# Patient Record
Sex: Male | Born: 1990 | Race: White | Hispanic: No | Marital: Single | Smoking: Former smoker
Health system: Southern US, Community
[De-identification: ages and names within clinical notes are randomized; demographics above are authoritative.]

## PROBLEM LIST (undated history)

## (undated) HISTORY — PX: HIP FRACTURE SURGERY: SHX118

---
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
## 2013-03-16 ENCOUNTER — Emergency Department (HOSPITAL_COMMUNITY): Payer: Managed Care, Other (non HMO)

## 2013-03-16 ENCOUNTER — Emergency Department (HOSPITAL_COMMUNITY)
Admission: EM | Admit: 2013-03-16 | Discharge: 2013-03-16 | Disposition: A | Discharge status: Home / Self Care | Payer: Managed Care, Other (non HMO) | Attending: Emergency Medicine | Admitting: Emergency Medicine

## 2013-03-16 ENCOUNTER — Encounter (HOSPITAL_COMMUNITY): Payer: Self-pay | Admitting: Emergency Medicine

## 2013-03-16 DIAGNOSIS — N201 Calculus of ureter: Secondary | ICD-10-CM | POA: Insufficient documentation

## 2013-03-16 DIAGNOSIS — R1110 Vomiting, unspecified: Secondary | ICD-10-CM | POA: Insufficient documentation

## 2013-03-16 DIAGNOSIS — Z87891 Personal history of nicotine dependence: Secondary | ICD-10-CM | POA: Insufficient documentation

## 2013-03-16 LAB — CBC WITH DIFFERENTIAL/PLATELET
Basophils Absolute: 0 10*3/uL (ref 0.0–0.1)
Eosinophils Absolute: 0 10*3/uL (ref 0.0–0.7)
Eosinophils Relative: 1 % (ref 0–5)
Hemoglobin: 15.2 g/dL (ref 13.0–17.0)
Monocytes Relative: 7 % (ref 3–12)
Neutro Abs: 4.7 10*3/uL (ref 1.7–7.7)
Neutrophils Relative %: 68 % (ref 43–77)
Platelets: 191 10*3/uL (ref 150–400)
WBC: 7 10*3/uL (ref 4.0–10.5)

## 2013-03-16 LAB — URINALYSIS, ROUTINE W REFLEX MICROSCOPIC
Leukocytes, UA: NEGATIVE
Nitrite: NEGATIVE
pH: 8.5 — ABNORMAL HIGH (ref 5.0–8.0)

## 2013-03-16 LAB — BASIC METABOLIC PANEL
BUN: 18 mg/dL (ref 6–23)
CO2: 22 mEq/L (ref 19–32)
GFR calc non Af Amer: >90 mL/min (ref >90)
Potassium: 3.3 mEq/L — ABNORMAL LOW (ref 3.5–5.1)
Sodium: 140 mEq/L (ref 135–145)

## 2013-03-16 LAB — URINE MICROSCOPIC-ADD ON

## 2013-03-16 MED ORDER — HYDROMORPHONE HCL PF 1 MG/ML IJ SOLN
1.0000 mg | Freq: Once | INTRAMUSCULAR | Status: AC
  Administered 2013-03-16: 1 mg via INTRAVENOUS
  Filled 2013-03-16: qty 1

## 2013-03-16 MED ORDER — OXYCODONE-ACETAMINOPHEN 5-325 MG PO TABS: 1.0000 | ORAL_TABLET | Freq: Four times a day (QID) | ORAL | Status: DC | PRN

## 2013-03-16 MED ORDER — ONDANSETRON HCL 4 MG/2ML IJ SOLN
4.0000 mg | Freq: Once | INTRAMUSCULAR | Status: AC
  Administered 2013-03-16: 4 mg via INTRAVENOUS
  Filled 2013-03-16: qty 2

## 2013-03-16 MED ORDER — SODIUM CHLORIDE 0.9 % IV BOLUS (SEPSIS)
1000.0000 mL | Freq: Once | INTRAVENOUS | Status: AC
  Administered 2013-03-16: 1000 mL via INTRAVENOUS

## 2013-03-16 NOTE — ED Notes (Signed)
MD at bedside. 

## 2013-03-16 NOTE — ED Provider Notes (Signed)
History    CSN: [id number] Arrival date & time 03/16/13  1949  First MD Initiated Contact with Patient 03/16/13 1957     Chief Complaint  Patient presents with  . Flank Pain   (Consider location/radiation/quality/duration/timing/severity/associated sxs/prior Treatment) Patient is a 22 y.o. male presenting with flank pain.  Flank Pain   Pt reports sudden onset of waxing and waning severe aching L flank pain about 3 hrs ago, occasionally radiating to L groin, no dysuria or hematuria. Some vomiting. No fever. No particular provoking or relieving factors.  History reviewed. No pertinent past medical history. Past Surgical History  Procedure Laterality Date  . Hip fracture surgery     No family history on file. History  Substance Use Topics  . Smoking status: Former Games developer  . Smokeless tobacco: Not on file  . Alcohol Use: Yes     Comment: occ    Review of Systems  Genitourinary: Positive for flank pain.   All other systems reviewed and are negative except as noted in HPI.   Allergies  Review of patient's allergies indicates no known allergies.  Home Medications  No current outpatient prescriptions on file. BP 127/54  Pulse 86  Temp(Src) 98.2 F (36.8 C) (Oral)  Resp 18  SpO2 100% Physical Exam  Nursing note and vitals reviewed. Constitutional: He is oriented to person, place, and time. He appears well-developed and well-nourished.  HENT:  Head: Normocephalic and atraumatic.  Eyes: EOM are normal. Pupils are equal, round, and reactive to light.  Neck: Normal range of motion. Neck supple.  Cardiovascular: Normal rate, normal heart sounds and intact distal pulses.   Pulmonary/Chest: Effort normal and breath sounds normal.  Abdominal: Bowel sounds are normal. He exhibits no distension. There is no tenderness.  Musculoskeletal: Normal range of motion. He exhibits no edema and no tenderness.  Neurological: He is alert and oriented to person, place, and time. He has  normal strength. No cranial nerve deficit or sensory deficit.  Skin: Skin is warm and dry. No rash noted.  Psychiatric: He has a normal mood and affect.    ED Course  Procedures (including critical care time) Labs Reviewed  CBC WITH DIFFERENTIAL - Abnormal; Notable for the following:    MCHC 36.7 (*)    All other components within normal limits  BASIC METABOLIC PANEL - Abnormal; Notable for the following:    Potassium 3.3 (*)    Glucose, Bld 118 (*)    All other components within normal limits  URINALYSIS, ROUTINE W REFLEX MICROSCOPIC - Abnormal; Notable for the following:    pH 8.5 (*)    Hgb urine dipstick LARGE (*)    Ketones, ur 15 (*)    All other components within normal limits  URINE MICROSCOPIC-ADD ON   Ct Abdomen Pelvis Wo Contrast  03/16/2013   **ADDENDUM** CREATED: 03/16/2013 20:51:29  A prior study from 2007 is available for comparison.  That study is associated with a different medical record number, MRN: [id number].  Since the prior study, the large cystic lesion in the spleen is not significantly changed.  It is approximately the same size however septations have become somewhat calcified and this is compatible with a chronic finding.  Pelvic fractures have now healed with deformity.  **END ADDENDUM** SIGNED BY: Marlowe Aschoff. Hoss, M.D.  03/16/2013   *RADIOLOGY REPORT*  Clinical Data: Left flank pain  CT ABDOMEN AND PELVIS WITHOUT CONTRAST  Technique:  Multidetector CT imaging of the abdomen and pelvis was performed following the  standard protocol without intravenous contrast.  Comparison: None.  Findings: No hydronephrosis. No renal calculus.  There is a 5 mm calculus at the left ureteral vesicle junction associated with mild dilatation of the left ureter.  Left SI joint screw is in place.  There is some bony bridging across the superior aspect of the SI joint.  No breakage or loosening of the hardware.  Healed right superior pubic ramus fracture and inferior pubic ramus fracture  with deformity is noted. There is deformity of the left inferior and superior pubic rami compatible with healed deformity.  Sclerotic change in the right iliac wing has a nonaggressive appearance.  There is a 6.6 x 5.2 x 5.8 cm cystic lesion in the anterior upper spleen.  There are thick septations which are partially calcified. Splenic tissue is seen surrounding the lesion therefore this is felt to represent an intrasplenic lesion rather than a subcapsular lesion.  It is separate from the pancreas.  Tiny gallstones are present.  The liver, pancreas, adrenal glands are within normal limits  Bladder and prostate are unremarkable.  IMPRESSION: 5 mm left ureteral vesicle junction calculus is associated with mild dilatation of the left ureter.  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  There are thickened partially calcified septations.  This may represent sequela of prior trauma such as a resolving injury or hematoma. Infectious and neoplastic etiology are not entirely excluded.  Postoperative and post-traumatic changes of the pelvis with healed deformity.  Cholelithiasis.   Original Report Authenticated By: Jolaine Click, M.D.   1. Ureteral stone     MDM  Reviewed CT images. Discussed with Dr. Bonnielee Haff who has added addendum as above compared to previous. Pain improved. Pt has distal ureteral stone. Plan discharge with Urology followup.   Charles B. Bernette Mayers, MD 03/16/13 2237

## 2013-03-16 NOTE — ED Notes (Signed)
Discharge instructions and information explained to patient. Patient provided urine strainer and specimen cup. Given directions on its use as well as follow up. Patient verbalized understanding.

## 2013-03-16 NOTE — ED Notes (Signed)
Patient transported to CT 

## 2013-03-16 NOTE — ED Notes (Addendum)
Patient with left flank pain, with nausea and vomiting.  Patient states that the nausea and vomiting is worse when the pain is increased.  Patient is pale at this time, with diaphoresis.  Patient states that at its worst it is 8/10, sharp in nature and moved down to groin.

## 2021-09-05 ENCOUNTER — Other Ambulatory Visit: Payer: Self-pay

## 2021-09-05 DIAGNOSIS — Z87891 Personal history of nicotine dependence: Secondary | ICD-10-CM | POA: Diagnosis not present

## 2021-09-05 DIAGNOSIS — K8012 Calculus of gallbladder with acute and chronic cholecystitis without obstruction: Principal | ICD-10-CM | POA: Insufficient documentation

## 2021-09-05 DIAGNOSIS — R1011 Right upper quadrant pain: Secondary | ICD-10-CM | POA: Diagnosis present

## 2021-09-05 DIAGNOSIS — U071 COVID-19: Secondary | ICD-10-CM | POA: Insufficient documentation

## 2021-09-06 ENCOUNTER — Other Ambulatory Visit: Payer: Self-pay

## 2021-09-06 ENCOUNTER — Emergency Department (HOSPITAL_COMMUNITY): Payer: BC Managed Care – PPO

## 2021-09-06 ENCOUNTER — Encounter (HOSPITAL_COMMUNITY)
Admission: EM | Disposition: A | Discharge status: Home / Self Care | Payer: Self-pay | Source: Home / Self Care | Attending: Emergency Medicine

## 2021-09-06 ENCOUNTER — Encounter (HOSPITAL_COMMUNITY): Payer: Self-pay | Admitting: Emergency Medicine

## 2021-09-06 ENCOUNTER — Observation Stay (HOSPITAL_COMMUNITY)
Admission: EM | Admit: 2021-09-06 | Discharge: 2021-09-06 | Disposition: A | Discharge status: Home / Self Care | Payer: BC Managed Care – PPO | Attending: Surgery | Admitting: Surgery

## 2021-09-06 ENCOUNTER — Observation Stay (HOSPITAL_COMMUNITY): Payer: BC Managed Care – PPO | Admitting: Anesthesiology

## 2021-09-06 DIAGNOSIS — K819 Cholecystitis, unspecified: Secondary | ICD-10-CM

## 2021-09-06 DIAGNOSIS — K810 Acute cholecystitis: Secondary | ICD-10-CM | POA: Diagnosis present

## 2021-09-06 DIAGNOSIS — R52 Pain, unspecified: Secondary | ICD-10-CM

## 2021-09-06 HISTORY — PX: CHOLECYSTECTOMY: SHX55

## 2021-09-06 LAB — CBC WITH DIFFERENTIAL/PLATELET
Abs Immature Granulocytes: 0.02 10*3/uL (ref 0.00–0.07)
Basophils Absolute: 0 10*3/uL (ref 0.0–0.1)
Basophils Relative: 0 %
Eosinophils Absolute: 0 10*3/uL (ref 0.0–0.5)
Eosinophils Relative: 0 %
HCT: 41.7 % (ref 39.0–52.0)
Hemoglobin: 14.8 g/dL (ref 13.0–17.0)
Immature Granulocytes: 0 %
Lymphocytes Relative: 12 %
Lymphs Abs: 0.9 10*3/uL (ref 0.7–4.0)
MCH: 30.3 pg (ref 26.0–34.0)
MCHC: 35.5 g/dL (ref 30.0–36.0)
MCV: 85.5 fL (ref 80.0–100.0)
Monocytes Absolute: 0.5 10*3/uL (ref 0.1–1.0)
Monocytes Relative: 6 %
Neutro Abs: 6.3 10*3/uL (ref 1.7–7.7)
Neutrophils Relative %: 82 %
Platelets: 260 10*3/uL (ref 150–400)
RBC: 4.88 MIL/uL (ref 4.22–5.81)
RDW: 11.6 % (ref 11.5–15.5)
WBC: 7.8 10*3/uL (ref 4.0–10.5)
nRBC: 0 % (ref 0.0–0.2)

## 2021-09-06 LAB — COMPREHENSIVE METABOLIC PANEL
ALT: 18 U/L (ref 0–44)
AST: 18 U/L (ref 15–41)
Albumin: 4.2 g/dL (ref 3.5–5.0)
Alkaline Phosphatase: 67 U/L (ref 38–126)
Anion gap: 15 (ref 5–15)
BUN: 13 mg/dL (ref 6–20)
CO2: 19 mmol/L — ABNORMAL LOW (ref 22–32)
Calcium: 9.3 mg/dL (ref 8.9–10.3)
Chloride: 103 mmol/L (ref 98–111)
Creatinine, Ser: 1.01 mg/dL (ref 0.61–1.24)
GFR, Estimated: >60 mL/min (ref >60)
Glucose, Bld: 133 mg/dL — ABNORMAL HIGH (ref 70–99)
Potassium: 3.3 mmol/L — ABNORMAL LOW (ref 3.5–5.1)
Sodium: 137 mmol/L (ref 135–145)
Total Bilirubin: 1.2 mg/dL (ref 0.3–1.2)
Total Protein: 6.9 g/dL (ref 6.5–8.1)

## 2021-09-06 LAB — URINALYSIS, ROUTINE W REFLEX MICROSCOPIC
Bilirubin Urine: NEGATIVE
Glucose, UA: NEGATIVE mg/dL
Hgb urine dipstick: NEGATIVE
Ketones, ur: >80 mg/dL — AB
Leukocytes,Ua: NEGATIVE
Nitrite: NEGATIVE
Protein, ur: NEGATIVE mg/dL
Specific Gravity, Urine: 1.025 (ref 1.005–1.030)
pH: 6 (ref 5.0–8.0)

## 2021-09-06 LAB — RESP PANEL BY RT-PCR (FLU A&B, COVID) ARPGX2
Influenza A by PCR: NEGATIVE
Influenza B by PCR: NEGATIVE
SARS Coronavirus 2 by RT PCR: POSITIVE — AB

## 2021-09-06 LAB — LIPASE, BLOOD: Lipase: 33 U/L (ref 11–51)

## 2021-09-06 SURGERY — LAPAROSCOPIC CHOLECYSTECTOMY
Anesthesia: General | Site: Abdomen

## 2021-09-06 MED ORDER — ENOXAPARIN SODIUM 40 MG/0.4ML IJ SOSY: 40.0000 mg | PREFILLED_SYRINGE | INTRAMUSCULAR | Status: DC

## 2021-09-06 MED ORDER — SODIUM CHLORIDE 0.9 % IR SOLN
Status: DC | PRN
  Administered 2021-09-06: 1000 mL

## 2021-09-06 MED ORDER — ONDANSETRON 4 MG PO TBDP: 4.0000 mg | ORAL_TABLET | Freq: Four times a day (QID) | ORAL | Status: DC | PRN

## 2021-09-06 MED ORDER — LIDOCAINE 2% (20 MG/ML) 5 ML SYRINGE
INTRAMUSCULAR | Status: DC | PRN
  Administered 2021-09-06: 100 mg via INTRAVENOUS

## 2021-09-06 MED ORDER — DOCUSATE SODIUM 100 MG PO CAPS: 100.0000 mg | ORAL_CAPSULE | Freq: Two times a day (BID) | ORAL | Status: DC

## 2021-09-06 MED ORDER — IBUPROFEN 200 MG PO TABS: 600.0000 mg | ORAL_TABLET | Freq: Three times a day (TID) | ORAL | 0 refills | Status: AC | PRN

## 2021-09-06 MED ORDER — SODIUM CHLORIDE 0.9 % IV SOLN
2.0000 g | INTRAVENOUS | Status: DC
  Administered 2021-09-06: 2 g via INTRAVENOUS
  Filled 2021-09-06: qty 20

## 2021-09-06 MED ORDER — SUGAMMADEX SODIUM 200 MG/2ML IV SOLN
INTRAVENOUS | Status: DC | PRN
  Administered 2021-09-06: 100 mg via INTRAVENOUS
  Administered 2021-09-06: 200 mg via INTRAVENOUS

## 2021-09-06 MED ORDER — ONDANSETRON HCL 4 MG/2ML IJ SOLN
INTRAMUSCULAR | Status: DC | PRN
Start: 2021-09-06 — End: 2021-09-06
  Administered 2021-09-06: 4 mg via INTRAVENOUS

## 2021-09-06 MED ORDER — DIPHENHYDRAMINE HCL 50 MG/ML IJ SOLN: 25.0000 mg | Freq: Four times a day (QID) | INTRAMUSCULAR | Status: DC | PRN

## 2021-09-06 MED ORDER — CHLORHEXIDINE GLUCONATE 0.12 % MT SOLN
OROMUCOSAL | Status: AC
  Administered 2021-09-06: 15 mL
  Filled 2021-09-06: qty 15

## 2021-09-06 MED ORDER — HYDRALAZINE HCL 20 MG/ML IJ SOLN: 10.0000 mg | Freq: Three times a day (TID) | INTRAMUSCULAR | Status: DC | PRN

## 2021-09-06 MED ORDER — BUPIVACAINE-EPINEPHRINE (PF) 0.25% -1:200000 IJ SOLN
INTRAMUSCULAR | Status: AC
  Filled 2021-09-06: qty 30

## 2021-09-06 MED ORDER — OXYCODONE HCL 5 MG PO TABS: 5.0000 mg | ORAL_TABLET | ORAL | Status: DC | PRN

## 2021-09-06 MED ORDER — DEXMEDETOMIDINE (PRECEDEX) IN NS 20 MCG/5ML (4 MCG/ML) IV SYRINGE
PREFILLED_SYRINGE | INTRAVENOUS | Status: DC | PRN
  Administered 2021-09-06: 80 ug via INTRAVENOUS
  Administered 2021-09-06: 20 ug via INTRAVENOUS

## 2021-09-06 MED ORDER — PROPOFOL 10 MG/ML IV BOLUS
INTRAVENOUS | Status: AC
  Filled 2021-09-06: qty 20

## 2021-09-06 MED ORDER — DEXAMETHASONE SODIUM PHOSPHATE 10 MG/ML IJ SOLN
INTRAMUSCULAR | Status: DC | PRN
Start: 2021-09-06 — End: 2021-09-06
  Administered 2021-09-06: 5 mg via INTRAVENOUS

## 2021-09-06 MED ORDER — AMISULPRIDE (ANTIEMETIC) 5 MG/2ML IV SOLN: 10.0000 mg | Freq: Once | INTRAVENOUS | Status: DC | PRN

## 2021-09-06 MED ORDER — POLYETHYLENE GLYCOL 3350 17 G PO PACK: 17.0000 g | PACK | Freq: Every day | ORAL | Status: DC | PRN

## 2021-09-06 MED ORDER — OXYCODONE HCL 5 MG PO TABS
5.0000 mg | ORAL_TABLET | Freq: Four times a day (QID) | ORAL | 0 refills | Status: AC | PRN
Start: 2021-09-06 — End: ?

## 2021-09-06 MED ORDER — MORPHINE SULFATE (PF) 2 MG/ML IV SOLN: 2.0000 mg | INTRAVENOUS | Status: DC | PRN

## 2021-09-06 MED ORDER — ACETAMINOPHEN 500 MG PO TABS: 1000.0000 mg | ORAL_TABLET | Freq: Four times a day (QID) | ORAL | Status: DC | PRN

## 2021-09-06 MED ORDER — BUPIVACAINE-EPINEPHRINE 0.25% -1:200000 IJ SOLN
INTRAMUSCULAR | Status: DC | PRN
  Administered 2021-09-06: 20 mL

## 2021-09-06 MED ORDER — 0.9 % SODIUM CHLORIDE (POUR BTL) OPTIME
TOPICAL | Status: DC | PRN
  Administered 2021-09-06: 1000 mL

## 2021-09-06 MED ORDER — SODIUM CHLORIDE 0.9 % IV SOLN: INTRAVENOUS | Status: DC

## 2021-09-06 MED ORDER — CELECOXIB 200 MG PO CAPS
200.0000 mg | ORAL_CAPSULE | Freq: Once | ORAL | Status: AC
  Administered 2021-09-06: 200 mg via ORAL
  Filled 2021-09-06: qty 1

## 2021-09-06 MED ORDER — FENTANYL CITRATE (PF) 100 MCG/2ML IJ SOLN: 25.0000 ug | INTRAMUSCULAR | Status: DC | PRN

## 2021-09-06 MED ORDER — PROPOFOL 10 MG/ML IV BOLUS
INTRAVENOUS | Status: DC | PRN
Start: 2021-09-06 — End: 2021-09-06
  Administered 2021-09-06: 150 mg via INTRAVENOUS

## 2021-09-06 MED ORDER — ACETAMINOPHEN 500 MG PO TABS
1000.0000 mg | ORAL_TABLET | Freq: Once | ORAL | Status: AC
  Administered 2021-09-06: 1000 mg via ORAL
  Filled 2021-09-06: qty 2

## 2021-09-06 MED ORDER — FENTANYL CITRATE (PF) 250 MCG/5ML IJ SOLN
INTRAMUSCULAR | Status: AC
  Filled 2021-09-06: qty 5

## 2021-09-06 MED ORDER — ONDANSETRON HCL 4 MG/2ML IJ SOLN
INTRAMUSCULAR | Status: AC
  Filled 2021-09-06: qty 8

## 2021-09-06 MED ORDER — DEXAMETHASONE SODIUM PHOSPHATE 10 MG/ML IJ SOLN
INTRAMUSCULAR | Status: AC
  Filled 2021-09-06: qty 1

## 2021-09-06 MED ORDER — MIDAZOLAM HCL 2 MG/2ML IJ SOLN
INTRAMUSCULAR | Status: DC | PRN
  Administered 2021-09-06: 2 mg via INTRAVENOUS

## 2021-09-06 MED ORDER — CHLORHEXIDINE GLUCONATE 0.12 % MT SOLN: 15.0000 mL | Freq: Once | OROMUCOSAL | Status: AC

## 2021-09-06 MED ORDER — ACETAMINOPHEN 500 MG PO TABS: 1000.0000 mg | ORAL_TABLET | Freq: Four times a day (QID) | ORAL | 0 refills | Status: AC | PRN

## 2021-09-06 MED ORDER — OXYCODONE-ACETAMINOPHEN 5-325 MG PO TABS
1.0000 | ORAL_TABLET | Freq: Once | ORAL | Status: AC
  Administered 2021-09-06: 1 via ORAL
  Filled 2021-09-06: qty 1

## 2021-09-06 MED ORDER — ONDANSETRON HCL 4 MG/2ML IJ SOLN: 4.0000 mg | Freq: Four times a day (QID) | INTRAMUSCULAR | Status: DC | PRN

## 2021-09-06 MED ORDER — DIPHENHYDRAMINE HCL 25 MG PO CAPS: 25.0000 mg | ORAL_CAPSULE | Freq: Four times a day (QID) | ORAL | Status: DC | PRN

## 2021-09-06 MED ORDER — DEXMEDETOMIDINE (PRECEDEX) IN NS 20 MCG/5ML (4 MCG/ML) IV SYRINGE
PREFILLED_SYRINGE | INTRAVENOUS | Status: AC
  Filled 2021-09-06: qty 5

## 2021-09-06 MED ORDER — ONDANSETRON 4 MG PO TBDP
4.0000 mg | ORAL_TABLET | Freq: Once | ORAL | Status: AC
  Administered 2021-09-06: 4 mg via ORAL
  Filled 2021-09-06: qty 1

## 2021-09-06 MED ORDER — MIDAZOLAM HCL 2 MG/2ML IJ SOLN
INTRAMUSCULAR | Status: AC
  Filled 2021-09-06: qty 2

## 2021-09-06 MED ORDER — SCOPOLAMINE 1 MG/3DAYS TD PT72
1.0000 | MEDICATED_PATCH | TRANSDERMAL | Status: DC
  Administered 2021-09-06: 1.5 mg via TRANSDERMAL
  Filled 2021-09-06: qty 1

## 2021-09-06 MED ORDER — LACTATED RINGERS IV SOLN
INTRAVENOUS | Status: DC | PRN
Start: 2021-09-06 — End: 2021-09-06

## 2021-09-06 MED ORDER — METHOCARBAMOL 500 MG PO TABS: 500.0000 mg | ORAL_TABLET | Freq: Four times a day (QID) | ORAL | Status: DC | PRN

## 2021-09-06 MED ORDER — PROMETHAZINE HCL 25 MG/ML IJ SOLN: 6.2500 mg | INTRAMUSCULAR | Status: DC | PRN

## 2021-09-06 MED ORDER — ROCURONIUM BROMIDE 10 MG/ML (PF) SYRINGE
PREFILLED_SYRINGE | INTRAVENOUS | Status: DC | PRN
  Administered 2021-09-06 (×2): 10 mg via INTRAVENOUS
  Administered 2021-09-06: 80 mg via INTRAVENOUS

## 2021-09-06 MED ORDER — FENTANYL CITRATE (PF) 250 MCG/5ML IJ SOLN
INTRAMUSCULAR | Status: DC | PRN
  Administered 2021-09-06: 100 ug via INTRAVENOUS
  Administered 2021-09-06: 50 ug via INTRAVENOUS
  Administered 2021-09-06: 100 ug via INTRAVENOUS

## 2021-09-06 SURGICAL SUPPLY — 41 items
ADH SKN CLS APL DERMABOND .7 (GAUZE/BANDAGES/DRESSINGS) ×2
APL PRP STRL LF DISP 70% ISPRP (MISCELLANEOUS) ×2
APPLIER CLIP 5 13 M/L LIGAMAX5 (MISCELLANEOUS) ×3
APR CLP MED LRG 5 ANG JAW (MISCELLANEOUS) ×2
BAG COUNTER SPONGE SURGICOUNT (BAG) ×4 IMPLANT
BAG SPEC RTRVL LRG 6X4 10 (ENDOMECHANICALS) ×2
BAG SPNG CNTER NS LX DISP (BAG) ×2
BLADE CLIPPER SURG (BLADE) IMPLANT
CANISTER SUCT 3000ML PPV (MISCELLANEOUS) ×4 IMPLANT
CHLORAPREP W/TINT 26 (MISCELLANEOUS) ×4 IMPLANT
CLIP APPLIE 5 13 M/L LIGAMAX5 (MISCELLANEOUS) ×3 IMPLANT
COVER MAYO STAND STRL (DRAPES) IMPLANT
COVER SURGICAL LIGHT HANDLE (MISCELLANEOUS) ×4 IMPLANT
DERMABOND ADVANCED (GAUZE/BANDAGES/DRESSINGS) ×1
DERMABOND ADVANCED .7 DNX12 (GAUZE/BANDAGES/DRESSINGS) ×3 IMPLANT
DRAPE C-ARM 42X120 X-RAY (DRAPES) IMPLANT
ELECT REM PT RETURN 9FT ADLT (ELECTROSURGICAL) ×3
ELECTRODE REM PT RTRN 9FT ADLT (ELECTROSURGICAL) ×3 IMPLANT
GLOVE SURG SIGNA 7.5 PF LTX (GLOVE) ×4 IMPLANT
GOWN STRL REUS W/ TWL LRG LVL3 (GOWN DISPOSABLE) ×6 IMPLANT
GOWN STRL REUS W/ TWL XL LVL3 (GOWN DISPOSABLE) ×3 IMPLANT
GOWN STRL REUS W/TWL LRG LVL3 (GOWN DISPOSABLE) ×6
GOWN STRL REUS W/TWL XL LVL3 (GOWN DISPOSABLE) ×3
KIT BASIN OR (CUSTOM PROCEDURE TRAY) ×4 IMPLANT
KIT TURNOVER KIT B (KITS) ×4 IMPLANT
NS IRRIG 1000ML POUR BTL (IV SOLUTION) ×4 IMPLANT
PAD ARMBOARD 7.5X6 YLW CONV (MISCELLANEOUS) ×4 IMPLANT
POUCH SPECIMEN RETRIEVAL 10MM (ENDOMECHANICALS) ×4 IMPLANT
SCISSORS LAP 5X35 DISP (ENDOMECHANICALS) ×4 IMPLANT
SET CHOLANGIOGRAPH 5 50 .035 (SET/KITS/TRAYS/PACK) IMPLANT
SET IRRIG TUBING LAPAROSCOPIC (IRRIGATION / IRRIGATOR) ×4 IMPLANT
SET TUBE SMOKE EVAC HIGH FLOW (TUBING) ×4 IMPLANT
SLEEVE ENDOPATH XCEL 5M (ENDOMECHANICALS) ×8 IMPLANT
SPECIMEN JAR SMALL (MISCELLANEOUS) ×4 IMPLANT
SUT MNCRL AB 4-0 PS2 18 (SUTURE) ×6 IMPLANT
TOWEL GREEN STERILE (TOWEL DISPOSABLE) ×4 IMPLANT
TOWEL GREEN STERILE FF (TOWEL DISPOSABLE) ×4 IMPLANT
TRAY LAPAROSCOPIC MC (CUSTOM PROCEDURE TRAY) ×4 IMPLANT
TROCAR XCEL BLUNT TIP 100MML (ENDOMECHANICALS) ×4 IMPLANT
TROCAR XCEL NON-BLD 5MMX100MML (ENDOMECHANICALS) ×4 IMPLANT
WATER STERILE IRR 1000ML POUR (IV SOLUTION) ×4 IMPLANT

## 2021-09-06 NOTE — Transfer of Care (Signed)
Immediate Anesthesia Transfer of Care Note  Patient: Angel Huber  Procedure(s) Performed: LAPAROSCOPIC CHOLECYSTECTOMY WITH POSSIBLE INTRAOPERATIVE CHOLANGIOGRAM (Abdomen)  Patient Location: PACU  Anesthesia Type:General  Level of Consciousness: drowsy, patient cooperative and responds to stimulation  Airway & Oxygen Therapy: Patient Spontanous Breathing  Post-op Assessment: Report given to RN and Post -op Vital signs reviewed and stable  Post vital signs: Reviewed and stable  Last Vitals:  Vitals Value Taken Time  BP 139/75 09/06/21 1701  Temp 37.1 C 09/06/21 1700  Pulse 90 09/06/21 1704  Resp 22 09/06/21 1704  SpO2 98 % 09/06/21 1704  Vitals shown include unvalidated device data.  Last Pain:  Vitals:   09/06/21 1518  TempSrc: Oral  PainSc: 0-No pain         Complications: No notable events documented.

## 2021-09-06 NOTE — Anesthesia Postprocedure Evaluation (Signed)
Anesthesia Post Note  Patient: Angel Huber  Procedure(s) Performed: LAPAROSCOPIC CHOLECYSTECTOMY WITH POSSIBLE INTRAOPERATIVE CHOLANGIOGRAM (Abdomen)     Patient location during evaluation: PACU Anesthesia Type: General Level of consciousness: sedated Pain management: pain level controlled Vital Signs Assessment: post-procedure vital signs reviewed and stable Respiratory status: spontaneous breathing and respiratory function stable Cardiovascular status: stable Postop Assessment: no apparent nausea or vomiting Anesthetic complications: no   No notable events documented.  Last Vitals:  Vitals:   09/06/21 1730 09/06/21 1745  BP: 119/76 115/67  Pulse: 66 66  Resp: 17 19  Temp:  37.1 C  SpO2: 98% 96%    Last Pain:  Vitals:   09/06/21 1745  TempSrc:   PainSc: 0-No pain                 Kavon Valenza DANIEL

## 2021-09-06 NOTE — Anesthesia Preprocedure Evaluation (Addendum)
Anesthesia Evaluation  Patient identified by MRN, date of birth, ID band Patient awake    Reviewed: Allergy & Precautions, NPO status , Patient's Chart, lab work & pertinent test results  History of Anesthesia Complications Negative for: history of anesthetic complications  Airway Mallampati: II  TM Distance: >3 FB Neck ROM: Full    Dental no notable dental hx. (+) Dental Advisory Given   Pulmonary neg pulmonary ROS, former smoker,    Pulmonary exam normal        Cardiovascular negative cardio ROS Normal cardiovascular exam     Neuro/Psych negative neurological ROS     GI/Hepatic Neg liver ROS,   Endo/Other  negative endocrine ROS  Renal/GU negative Renal ROS     Musculoskeletal negative musculoskeletal ROS (+)   Abdominal   Peds  Hematology negative hematology ROS (+)   Anesthesia Other Findings   Reproductive/Obstetrics                           Anesthesia Physical Anesthesia Plan  ASA: 2  Anesthesia Plan: General   Post-op Pain Management: Celebrex PO (pre-op) and Tylenol PO (pre-op)   Induction:   PONV Risk Score and Plan: 4 or greater and Ondansetron, Dexamethasone, Midazolam and Scopolamine patch - Pre-op  Airway Management Planned: Oral ETT  Additional Equipment:   Intra-op Plan:   Post-operative Plan: Extubation in OR  Informed Consent: I have reviewed the patients History and Physical, chart, labs and discussed the procedure including the risks, benefits and alternatives for the proposed anesthesia with the patient or authorized representative who has indicated his/her understanding and acceptance.     Dental advisory given  Plan Discussed with: Anesthesiologist, CRNA and Surgeon  Anesthesia Plan Comments:        Anesthesia Quick Evaluation

## 2021-09-06 NOTE — ED Notes (Signed)
RN unsuccessful x2 with IV

## 2021-09-06 NOTE — H&P (Addendum)
SNYDER BOSSIER 05/07/1991  [id number].    Requesting MD: Dr. Milton Ferguson Chief Complaint/Reason for Consult: Cholecystitis   HPI: Angel Huber is a 31 y.o. male with no significant pmhx who presented to the ED for abdominal pain.  Patient reports 2 days ago he began having constant right upper abdominal pain with radiation to his back/R flank. Initial episode improved but since then his symptoms have waxed and waned although were less severe. Last night he developed severe pain again. Associated with nausea and vomiting. Pain is worse after PO intake. He also felt like he looked a little yellow/jaundice the other day. No hx of the same.  He received Percocet in the ED with mild relief.  No associated fever, chills, diarrhea or urinary symptoms.    In the ED he has been afebrile without tachycardia or hypotension. Workup significant with WBC 7.8, lipase wnl, lft's wnl; CT renal stone study with Cholelithiasis with GB distension and question of cystic duct stone. RUQ [place] with 1.1 cm stone in gallbladder neck, gallbladder wall thickness to 7.4 mm.  No mention of pericholecystic fluid.  CBD within normal limits at 4.3 mm. We were asked to see.   He has had nothing to eat/drink today  No PMHx No history of prior abdominal surgeries He is not on blood thinners. Tobacco use: Former  Alcohol use: Occasional  Employment: Clinical cytogeneticist for [employer]  ROS: Review of Systems  Constitutional:  Negative for chills and fever.  Gastrointestinal:  Positive for abdominal pain, nausea and vomiting.  Genitourinary:  Positive for flank pain.  All other systems reviewed and are negative.  No family history on file.  History reviewed. No pertinent past medical history.  Past Surgical History:  Procedure Laterality Date   HIP FRACTURE SURGERY      Social History:  reports that he has quit smoking. He does not have any smokeless tobacco history on file. He reports current alcohol use. He  reports current drug use. Drug: Marijuana.  Allergies: No Known Allergies  (Not in a hospital admission)    Physical Exam: Blood pressure 109/64, pulse 61, temperature 98.3 F (36.8 C), temperature source Oral, resp. rate 20, height 6' (1.829 m), weight 86.2 kg, SpO2 97 %. General: pleasant, WD/WN male who is laying in bed in NAD HEENT: head is normocephalic, atraumatic.  Sclera are noninjected.  Pupils equal and round.  Ears and nose without any masses or lesions.  Mouth is pink and moist. Dentition fair Heart: regular, rate, and rhythm.  Normal s1,s2. No obvious murmurs, gallops, or rubs noted.  Palpable pedal pulses bilaterally  Lungs: CTAB, no wheezes, rhonchi, or rales noted.  Respiratory effort nonlabored Abd: Soft, ND, +BS, no masses, hernias, or organomegaly. Mild epigastric and RUQ TTP without peritonitis MS: no BUE/BLE edema, calves soft and nontender Skin: warm and dry with no masses, lesions, or rashes Psych: A&Ox4 with an appropriate affect Neuro: cranial nerves grossly intact, equal strength in BUE/BLE bilaterally, normal speech, thought process intact, moves all extremities, gait not assessed   Results for orders placed or performed during the hospital encounter of 09/06/21 (from the past 48 hour(s))  Comprehensive metabolic panel     Status: Abnormal   Collection Time: 09/06/21 12:37 AM  Result Value Ref Range   Sodium 137 135 - 145 mmol/L   Potassium 3.3 (L) 3.5 - 5.1 mmol/L   Chloride 103 98 - 111 mmol/L   CO2 19 (L) 22 - 32 mmol/L  Glucose, Bld 133 (H) 70 - 99 mg/dL    Comment: Glucose reference range applies only to samples taken after fasting for at least 8 hours.   BUN 13 6 - 20 mg/dL   Creatinine, Ser 1.01 0.61 - 1.24 mg/dL   Calcium 9.3 8.9 - 10.3 mg/dL   Total Protein 6.9 6.5 - 8.1 g/dL   Albumin 4.2 3.5 - 5.0 g/dL   AST 18 15 - 41 U/L   ALT 18 0 - 44 U/L   Alkaline Phosphatase 67 38 - 126 U/L   Total Bilirubin 1.2 0.3 - 1.2 mg/dL   GFR, Estimated  >60 >60 mL/min    Comment: (NOTE) Calculated using the CKD-EPI Creatinine Equation (2021)    Anion gap 15 5 - 15    Comment: Performed at [facility] [address] [place], [place] [address]  Lipase, blood     Status: None   Collection Time: 09/06/21 12:37 AM  Result Value Ref Range   Lipase 33 11 - 51 U/L    Comment: Performed at [facility], [place] [address] [place], [place] [address]  CBC with Differential     Status: None   Collection Time: 09/06/21 12:37 AM  Result Value Ref Range   WBC 7.8 4.0 - 10.5 K/uL   RBC 4.88 4.22 - 5.81 MIL/uL   Hemoglobin 14.8 13.0 - 17.0 g/dL   HCT 41.7 39.0 - 52.0 %   MCV 85.5 80.0 - 100.0 fL   MCH 30.3 26.0 - 34.0 pg   MCHC 35.5 30.0 - 36.0 g/dL   RDW 11.6 11.5 - 15.5 %   Platelets 260 150 - 400 K/uL   nRBC 0.0 0.0 - 0.2 %   Neutrophils Relative % 82 %   Neutro Abs 6.3 1.7 - 7.7 K/uL   Lymphocytes Relative 12 %   Lymphs Abs 0.9 0.7 - 4.0 K/uL   Monocytes Relative 6 %   Monocytes Absolute 0.5 0.1 - 1.0 K/uL   Eosinophils Relative 0 %   Eosinophils Absolute 0.0 0.0 - 0.5 K/uL   Basophils Relative 0 %   Basophils Absolute 0.0 0.0 - 0.1 K/uL   Immature Granulocytes 0 %   Abs Immature Granulocytes 0.02 0.00 - 0.07 K/uL    Comment: Performed at [facility], [address] [place], [place] [address]  Urinalysis, Routine w reflex microscopic Urine, Clean Catch     Status: Abnormal   Collection Time: 09/06/21 11:29 AM  Result Value Ref Range   Color, Urine YELLOW YELLOW   APPearance CLEAR CLEAR   Specific Gravity, Urine 1.025 1.005 - 1.030   pH 6.0 5.0 - 8.0   Glucose, UA NEGATIVE NEGATIVE mg/dL   Hgb urine dipstick NEGATIVE NEGATIVE   Bilirubin Urine NEGATIVE NEGATIVE   Ketones, ur >80 (A) NEGATIVE mg/dL   Protein, ur NEGATIVE NEGATIVE mg/dL   Nitrite NEGATIVE NEGATIVE   Leukocytes,Ua NEGATIVE NEGATIVE    Comment: Microscopic not done on urines with negative protein, blood, leukocytes, nitrite, or glucose < 500  mg/dL. Performed at [facility], [place] [address] [place], [place] [address]    CT Renal Stone Study  Result Date: 09/06/2021 CLINICAL DATA:  Flank pain. EXAM: CT ABDOMEN AND PELVIS WITHOUT CONTRAST TECHNIQUE: Multidetector CT imaging of the abdomen and pelvis was performed following the standard protocol without IV contrast. RADIATION DOSE REDUCTION: This exam was performed according to the departmental dose-optimization program which includes automated exposure control, adjustment of  the mA and/or kV according to patient size and/or use of iterative reconstruction technique. COMPARISON:  CT abdomen and pelvis 03/16/2013. FINDINGS: Lower chest: No acute abnormality. Hepatobiliary: Small gallstones are present. There is a questionable punctate calculus within the cystic duct image 3/25. Gallbladder is distended. There is no biliary ductal dilatation. Bile ducts are within normal limits. Pancreas: Unremarkable. No pancreatic ductal dilatation or surrounding inflammatory changes. Spleen: Complex cystic area in the spleen with calcified septations measuring 5.6 x 9.0 cm has mildly increased in size compared to 2014. Spleen is otherwise within normal limits. Adrenals/Urinary Tract: Adrenal glands are unremarkable. Kidneys are normal, without renal calculi, focal lesion, or hydronephrosis. Bladder is unremarkable. Stomach/Bowel: Stomach is within normal limits. Appendix appears normal. No evidence of bowel wall thickening, distention, or inflammatory changes. There is a large amount of stool throughout the colon. Vascular/Lymphatic: No significant vascular findings are present. No enlarged abdominal or pelvic lymph nodes. Reproductive: Prostate is unremarkable. Other: There is a small fat containing umbilical hernia. There is no ascites or free. Musculoskeletal: Left sacroiliac joint screw is again noted. Sequelae from old trauma involving pelvis appears similar to the prior study. No acute fractures are  seen. IMPRESSION: 1. Cholelithiasis with gallbladder distension and questionable tiny stone in the cystic duct. Please correlate clinically for signs and symptoms of cholecystitis. Consider ultrasound. 2. No evidence for urinary tract calculus or obstruction. 3. Complex cystic area in the spleen has minimally increased in size, likely sequelae from old trauma. Electronically Signed   By: Ronney Asters M.D.   On: 09/06/2021 01:13   US Abdomen Limited RUQ (LIVER/GB)  Result Date: 09/06/2021 CLINICAL DATA:  Pain EXAM: ULTRASOUND ABDOMEN LIMITED RIGHT UPPER QUADRANT COMPARISON:  Same day CT abdomen and pelvis. FINDINGS: Gallbladder: There is a 1.1 cm stone in the gallbladder neck. There is intraluminal sludge. Increased wall thickness measuring 7.4 mm. The gallbladder is mildly dilated. Negative sonographic Murphy sign. Common bile duct: Diameter: 4.3 mm Liver: No focal lesion identified. Within normal limits in parenchymal echogenicity. Portal vein is patent on color Doppler imaging with normal direction of blood flow towards the liver. Other: None. IMPRESSION: Mildly dilated gallbladder with wall thickening, intraluminal sludge and a 1.1 cm stone in the gallbladder neck. Findings are suspicious for acute cholecystitis but overall equivocal given a negative sonographic Murphy sign. Correlate with laboratory findings. If there is further concern for acute cholecystitis, HIDA scan may be useful. Electronically Signed   By: Maurine Simmering M.D.   On: 09/06/2021 10:38    Anti-infectives (From admission, onward)    None       Assessment/Plan Acute Cholecystitis  - Patient hx, exam, labs and imaging c/w Acute Cholecystitis.  - Plan for admission to observation. IV abx and OR today for Laparoscopic Cholecystectomy with possible IOC.  -  I have explained the procedure, risks, and aftercare of Laparoscopic cholecystectomy with IOC.  Risks include but are not limited to anesthesia (MI, CVA, death), bleeding,  infection, wound problems, hernia, bile leak, injury to common bile duct/liver/intestine, increased risk of DVT/PE and diarrhea post op.  He seems to understand and agrees to proceed.  FEN - NPO, IVF VTE - SCDs, Lovenox ID - Rocephin  High Medical Decision Making  Margie Billet, [facility] 09/06/2021, 12:52 PM Please see Amion for pager number during day hours 7:00am-4:30pm

## 2021-09-06 NOTE — ED Triage Notes (Signed)
Pt reports right sided abdominal pain that goes around to his back.  Hx of kidney stones, "might feel the same"  Pt has had emesis "due to pain'."

## 2021-09-06 NOTE — ED Notes (Signed)
Patient transported to Ultrasound 

## 2021-09-06 NOTE — ED Notes (Signed)
Pt is undressed completely for OR. Pt belongings in bag.

## 2021-09-06 NOTE — ED Provider Notes (Signed)
[facility] EMERGENCY DEPARTMENT Provider Note   CSN: [id number] Arrival date & time: 09/05/21  2341     History  Chief Complaint  Patient presents with   Abdominal Pain    Angel Huber is a 31 y.o. male.  Patient complains of right upper quadrant abdominal pain that started yesterday.  It was severe initially.  He has no past medical history.  The history is provided by the patient and medical records. No language interpreter was used.  Abdominal Pain Pain location:  Epigastric Pain quality: aching   Pain radiates to:  Does not radiate Pain severity:  Moderate Onset quality:  Sudden Timing:  Constant Progression:  Worsening Chronicity:  New Associated symptoms: no chest pain, no chills, no cough, no dysuria, no fever, no hematuria, no shortness of breath, no sore throat and no vomiting       Home Medications Prior to Admission medications   Medication Sig Start Date End Date Taking? Authorizing Provider  oxyCODONE-acetaminophen (PERCOCET/ROXICET) 5-325 MG per tablet Take 1-2 tablets by mouth every 6 (six) hours as needed for pain. 03/16/13   Truddie Hidden, MD      Allergies    Patient has no known allergies.    Review of Systems   Review of Systems  Constitutional:  Negative for chills and fever.  HENT:  Negative for ear pain and sore throat.   Eyes:  Negative for pain and visual disturbance.  Respiratory:  Negative for cough and shortness of breath.   Cardiovascular:  Negative for chest pain and palpitations.  Gastrointestinal:  Positive for abdominal pain. Negative for vomiting.  Genitourinary:  Negative for dysuria and hematuria.  Musculoskeletal:  Negative for arthralgias and back pain.  Skin:  Negative for color change and rash.  Neurological:  Negative for seizures and syncope.  All other systems reviewed and are negative.  Physical Exam Updated Vital Signs BP 109/64    Pulse 61    Temp 98.3 F (36.8 C) (Oral)    Resp 20     Ht 6' (1.829 m)    Wt 86.2 kg    SpO2 97%    BMI 25.77 kg/m  Physical Exam Constitutional:      Appearance: He is well-developed.  HENT:     Head: Normocephalic.     Nose: Nose normal.  Eyes:     General: No scleral icterus.    Conjunctiva/sclera: Conjunctivae normal.  Neck:     Thyroid: No thyromegaly.  Cardiovascular:     Rate and Rhythm: Normal rate and regular rhythm.     Heart sounds: No murmur heard.   No friction rub. No gallop.  Pulmonary:     Breath sounds: No stridor. No wheezing or rales.  Chest:     Chest wall: No tenderness.  Abdominal:     General: There is no distension.     Tenderness: There is abdominal tenderness. There is no rebound.  Musculoskeletal:        General: Normal range of motion.     Cervical back: Neck supple.  Lymphadenopathy:     Cervical: No cervical adenopathy.  Skin:    Findings: No erythema or rash.  Neurological:     Mental Status: He is oriented to person, place, and time.     Motor: No abnormal muscle tone.     Coordination: Coordination normal.  Psychiatric:        Behavior: Behavior normal.    ED Results / Procedures / Treatments  Labs (all labs ordered are listed, but only abnormal results are displayed) Labs Reviewed  COMPREHENSIVE METABOLIC PANEL - Abnormal; Notable for the following components:      Result Value   Potassium 3.3 (*)    CO2 19 (*)    Glucose, Bld 133 (*)    All other components within normal limits  URINALYSIS, ROUTINE W REFLEX MICROSCOPIC - Abnormal; Notable for the following components:   Ketones, ur >80 (*)    All other components within normal limits  LIPASE, BLOOD  CBC WITH DIFFERENTIAL/PLATELET    EKG None  Radiology CT Renal Stone Study  Result Date: 09/06/2021 CLINICAL DATA:  Flank pain. EXAM: CT ABDOMEN AND PELVIS WITHOUT CONTRAST TECHNIQUE: Multidetector CT imaging of the abdomen and pelvis was performed following the standard protocol without IV contrast. RADIATION DOSE REDUCTION:  This exam was performed according to the departmental dose-optimization program which includes automated exposure control, adjustment of the mA and/or kV according to patient size and/or use of iterative reconstruction technique. COMPARISON:  CT abdomen and pelvis 03/16/2013. FINDINGS: Lower chest: No acute abnormality. Hepatobiliary: Small gallstones are present. There is a questionable punctate calculus within the cystic duct image 3/25. Gallbladder is distended. There is no biliary ductal dilatation. Bile ducts are within normal limits. Pancreas: Unremarkable. No pancreatic ductal dilatation or surrounding inflammatory changes. Spleen: Complex cystic area in the spleen with calcified septations measuring 5.6 x 9.0 cm has mildly increased in size compared to 2014. Spleen is otherwise within normal limits. Adrenals/Urinary Tract: Adrenal glands are unremarkable. Kidneys are normal, without renal calculi, focal lesion, or hydronephrosis. Bladder is unremarkable. Stomach/Bowel: Stomach is within normal limits. Appendix appears normal. No evidence of bowel wall thickening, distention, or inflammatory changes. There is a large amount of stool throughout the colon. Vascular/Lymphatic: No significant vascular findings are present. No enlarged abdominal or pelvic lymph nodes. Reproductive: Prostate is unremarkable. Other: There is a small fat containing umbilical hernia. There is no ascites or free. Musculoskeletal: Left sacroiliac joint screw is again noted. Sequelae from old trauma involving pelvis appears similar to the prior study. No acute fractures are seen. IMPRESSION: 1. Cholelithiasis with gallbladder distension and questionable tiny stone in the cystic duct. Please correlate clinically for signs and symptoms of cholecystitis. Consider ultrasound. 2. No evidence for urinary tract calculus or obstruction. 3. Complex cystic area in the spleen has minimally increased in size, likely sequelae from old trauma.  Electronically Signed   By: Ronney Asters M.D.   On: 09/06/2021 01:13   US Abdomen Limited RUQ (LIVER/GB)  Result Date: 09/06/2021 CLINICAL DATA:  Pain EXAM: ULTRASOUND ABDOMEN LIMITED RIGHT UPPER QUADRANT COMPARISON:  Same day CT abdomen and pelvis. FINDINGS: Gallbladder: There is a 1.1 cm stone in the gallbladder neck. There is intraluminal sludge. Increased wall thickness measuring 7.4 mm. The gallbladder is mildly dilated. Negative sonographic Murphy sign. Common bile duct: Diameter: 4.3 mm Liver: No focal lesion identified. Within normal limits in parenchymal echogenicity. Portal vein is patent on color Doppler imaging with normal direction of blood flow towards the liver. Other: None. IMPRESSION: Mildly dilated gallbladder with wall thickening, intraluminal sludge and a 1.1 cm stone in the gallbladder neck. Findings are suspicious for acute cholecystitis but overall equivocal given a negative sonographic Murphy sign. Correlate with laboratory findings. If there is further concern for acute cholecystitis, HIDA scan may be useful. Electronically Signed   By: Maurine Simmering M.D.   On: 09/06/2021 10:38    Procedures Procedures    Medications  Ordered in ED Medications  ondansetron (ZOFRAN-ODT) disintegrating tablet 4 mg (4 mg Oral Given 09/06/21 0036)  oxyCODONE-acetaminophen (PERCOCET/ROXICET) 5-325 MG per tablet 1 tablet (1 tablet Oral Given 09/06/21 0036)    ED Course/ Medical Decision Making/ A&P                           Medical Decision Making  Patient with cholecystitis.  Patient will be seen by surgery.   After patient was seen by surgery they decided to admit him and to do a cholecystectomy    This patient presents to the ED for concern of abdominal pain, this involves an extensive number of treatment options, and is a complaint that carries with it a high risk of complications and morbidity.  The differential diagnosis includes cholecystitis, gastritis   Co morbidities that  complicate the patient evaluation  None   Additional history obtained:  Additional history obtained from patient External records from outside source obtained and reviewed including old records   Lab Tests:  I Ordered, and personally interpreted labs.  The pertinent results include: COVID test positive   Imaging Studies ordered:  I ordered imaging studies including ultrasound abdomen I independently visualized and interpreted imaging which showed cholecystitis I agree with the radiologist interpretation   Cardiac Monitoring:  The patient was maintained on a cardiac monitor.  I personally viewed and interpreted the cardiac monitored which showed an underlying rhythm of: Normal sinus rhythm   Medicines ordered and prescription drug management:  I ordered medication including Zofran for nausea Reevaluation of the patient after these medicines showed that the patient improved I have reviewed the patients home medicines and have made adjustments as needed   Test Considered:  MRI  Critical Interventions:  Surgical consult   Consultations Obtained:  I requested consultation with the surgery,  and discussed lab and imaging findings as well as pertinent plan - they recommend: Patient had cholecystectomy   Problem List / ED Course:  Cholecystitis   Reevaluation:  After the interventions noted above, I reevaluated the patient and found that they have :improved   Social Determinants of Health:  None   Dispostion:  After consideration of the diagnostic results and the patients response to treatment, I feel that the patent would benefit from admission for surgery.         Final Clinical Impression(s) / ED Diagnoses Final diagnoses:  Pain  Cholecystitis    Rx / DC Orders ED Discharge Orders     None         Milton Ferguson, MD 09/08/21 1126

## 2021-09-06 NOTE — Discharge Instructions (Addendum)
[facility] ° °Please arrive at least 30 min before your appointment to complete your check in paperwork.  If you are unable to arrive 30 min prior to your appointment time we may have to cancel or reschedule you. °LAPAROSCOPIC SURGERY: POST OP INSTRUCTIONS °Always review your discharge instruction sheet given to you by the facility where your surgery was performed. °IF YOU HAVE DISABILITY OR FAMILY LEAVE FORMS, YOU MUST BRING THEM TO THE OFFICE FOR PROCESSING.   °DO NOT GIVE THEM TO YOUR DOCTOR. ° °PAIN CONTROL ° °First take acetaminophen (Tylenol) AND/or ibuprofen (Advil) to control your pain after surgery.  Follow directions on package.  Taking acetaminophen (Tylenol) and/or ibuprofen (Advil) regularly after surgery will help to control your pain and lower the amount of prescription pain medication you may need.  You should not take more than 4,000 mg (4 grams) of acetaminophen (Tylenol) in 24 hours.  You should not take ibuprofen (Advil), aleve, motrin, naprosyn or other NSAIDS if you have a history of stomach ulcers or chronic kidney disease.  °A prescription for pain medication may be given to you upon discharge.  Take your pain medication as prescribed, if you still have uncontrolled pain after taking acetaminophen (Tylenol) or ibuprofen (Advil). °Use ice packs to help control pain. °If you need a refill on your pain medication, please contact your pharmacy.  They will contact our office to request authorization. Prescriptions will not be filled after 5pm or on week-ends. ° °HOME MEDICATIONS °Take your usually prescribed medications unless otherwise directed. ° °DIET °You should follow a light diet the first few days after arrival home.  Be sure to include lots of fluids daily. Avoid fatty, fried foods.  ° °CONSTIPATION °It is common to experience some constipation after surgery and if you are taking pain medication.  Increasing fluid intake and taking a stool softener (such as Colace)  will usually help or prevent this problem from occurring.  A mild laxative (Milk of Magnesia or Miralax) should be taken according to package instructions if there are no bowel movements after 48 hours. ° °WOUND/INCISION CARE °Most patients will experience some swelling and bruising in the area of the incisions.  Ice packs will help.  Swelling and bruising can take several days to resolve.  °Unless discharge instructions indicate otherwise, follow guidelines below  °STERI-STRIPS - you may remove your outer bandages 48 hours after surgery, and you may shower at that time.  You have steri-strips (small skin tapes) in place directly over the incision.  These strips should be left on the skin for 7-10 days.   °DERMABOND/SKIN GLUE - you may shower in 24 hours.  The glue will flake off over the next 2-3 weeks. °Any sutures or staples will be removed at the office during your follow-up visit. ° °ACTIVITIES °You may resume regular (light) daily activities beginning the next day--such as daily self-care, walking, climbing stairs--gradually increasing activities as tolerated.  You may have sexual intercourse when it is comfortable.  Refrain from any heavy lifting or straining until approved by your doctor. °You may drive when you are no longer taking prescription pain medication, you can comfortably wear a seatbelt, and you can safely maneuver your car and apply brakes. ° °FOLLOW-UP °You should see your doctor in the office for a follow-up appointment approximately 2-3 weeks after your surgery.  You should have been given your post-op/follow-up appointment when your surgery was scheduled.  If you did not receive a post-op/follow-up appointment, make sure   that you call for this appointment within a day or two after you arrive home to insure a convenient appointment time.  OTHER INSTRUCTIONS  WHEN TO CALL YOUR DOCTOR: Fever over 101.0 Inability to urinate Continued bleeding from incision. Increased pain, redness, or  drainage from the incision. Increasing abdominal pain  The clinic staff is available to answer your questions during regular business hours.  Please don't hesitate to call and ask to speak to one of the nurses for clinical concerns.  If you have a medical emergency, go to the nearest emergency room or call 911.  A surgeon from [facility] is always on call at the hospital. [address], [address], [place], [place]  [address] ? [address] [address], [place], [place]   [address] [phone] ? [phone] ? FAX [phone]      Managing Your Pain After Surgery Without Opioids    Thank you for participating in our program to help patients manage their pain after surgery without opioids. This is part of our effort to provide you with the best care possible, without exposing you or your family to the risk that opioids pose.  What pain can I expect after surgery? You can expect to have some pain after surgery. This is normal. The pain is typically worse the day after surgery, and quickly begins to get better. Many studies have found that many patients are able to manage their pain after surgery with Over-the-Counter (OTC) medications such as Tylenol and Motrin. If you have a condition that does not allow you to take Tylenol or Motrin, notify your surgical team.  How will I manage my pain? The best strategy for controlling your pain after surgery is around the clock pain control with Tylenol (acetaminophen) and Motrin (ibuprofen or Advil). Alternating these medications with each other allows you to maximize your pain control. In addition to Tylenol and Motrin, you can use heating pads or ice packs on your incisions to help reduce your pain.  How will I alternate your regular strength over-the-counter pain medication? You will take a dose of pain medication every three hours. Start by taking 650 mg of Tylenol (2 pills of 325 mg) 3 hours later take 600 mg of Motrin (3 pills of 200  mg) 3 hours after taking the Motrin take 650 mg of Tylenol 3 hours after that take 600 mg of Motrin.   - 1 -  See example - if your first dose of Tylenol is at 12:00 PM   12:00 PM Tylenol 650 mg (2 pills of 325 mg)  3:00 PM Motrin 600 mg (3 pills of 200 mg)  6:00 PM Tylenol 650 mg (2 pills of 325 mg)  9:00 PM Motrin 600 mg (3 pills of 200 mg)  Continue alternating every 3 hours   We recommend that you follow this schedule around-the-clock for at least 3 days after surgery, or until you feel that it is no longer needed. Use the table on the last page of this handout to keep track of the medications you are taking. Important: Do not take more than 3000mg of Tylenol or 3200mg of Motrin in a 24-hour period. Do not take ibuprofen/Motrin if you have a history of bleeding stomach ulcers, severe kidney disease, &/or actively taking a blood thinner  What if I still have pain? If you have pain that is not controlled with the over-the-counter pain medications (Tylenol and Motrin or Advil) you might have what we call "breakthrough" pain. You will   receive a prescription for a small amount of an opioid pain medication such as Oxycodone, Tramadol, or Tylenol with Codeine. Use these opioid pills in the first 24 hours after surgery if you have breakthrough pain. Do not take more than 1 pill every 4-6 hours.  If you still have uncontrolled pain after using all opioid pills, don't hesitate to call our staff using the number provided. We will help make sure you are managing your pain in the best way possible, and if necessary, we can provide a prescription for additional pain medication.   Day 1    Time  Name of Medication Number of pills taken  Amount of Acetaminophen  Pain Level   Comments  AM PM       AM PM       AM PM       AM PM       AM PM       AM PM       AM PM       AM PM       Total Daily amount of Acetaminophen Do not take more than  3,000 mg per day      Day 2    Time  Name  of Medication Number of pills taken  Amount of Acetaminophen  Pain Level   Comments  AM PM       AM PM       AM PM       AM PM       AM PM       AM PM       AM PM       AM PM       Total Daily amount of Acetaminophen Do not take more than  3,000 mg per day      Day 3    Time  Name of Medication Number of pills taken  Amount of Acetaminophen  Pain Level   Comments  AM PM       AM PM       AM PM       AM PM         AM PM       AM PM       AM PM       AM PM       Total Daily amount of Acetaminophen Do not take more than  3,000 mg per day      Day 4    Time  Name of Medication Number of pills taken  Amount of Acetaminophen  Pain Level   Comments  AM PM       AM PM       AM PM       AM PM       AM PM       AM PM       AM PM       AM PM       Total Daily amount of Acetaminophen Do not take more than  3,000 mg per day      Day 5    Time  Name of Medication Number of pills taken  Amount of Acetaminophen  Pain Level   Comments  AM PM       AM PM       AM PM       AM PM       AM PM         AM PM       AM PM       AM PM       Total Daily amount of Acetaminophen Do not take more than  3,000 mg per day       Day 6    Time  Name of Medication Number of pills taken  Amount of Acetaminophen  Pain Level  Comments  AM PM       AM PM       AM PM       AM PM       AM PM       AM PM       AM PM       AM PM       Total Daily amount of Acetaminophen Do not take more than  3,000 mg per day      Day 7    Time  Name of Medication Number of pills taken  Amount of Acetaminophen  Pain Level   Comments  AM PM       AM PM       AM PM       AM PM       AM PM       AM PM       AM PM       AM PM       Total Daily amount of Acetaminophen Do not take more than  3,000 mg per day        For additional information about how and where to safely dispose of unused opioid medications - https://www.morepowerfulnc.org  Disclaimer: This  document contains information and/or instructional materials adapted from [facility] for the typical patient with your condition. It does not replace medical advice from your health care provider because your experience may differ from that of the typical patient. Talk to your health care provider if you have any questions about this document, your condition or your treatment plan. Adapted from [facility]   

## 2021-09-06 NOTE — ED Provider Triage Note (Signed)
Emergency Medicine Provider Triage Evaluation Note  VIBHAV WADDILL , a 31 y.o. male  was evaluated in triage.  Pt complains of abdominal pain.  Symptoms started 3 hours ago patient reports generalized abdominal pain worse on the right side that radiates to the back.  He reports it was sudden in onset and has been constant.  Associated with nausea and vomiting.  No diarrhea, constipation or urinary symptoms.  No associated chest pain.  No meds for pain prior to arrival aside from Gas-X which she did not keep down.  History of kidney stone which may feel somewhat similar.  Review of Systems  Positive: Abdominal pain, nausea, vomiting Negative: Fevers, diarrhea, chest pain, shortness of breath, dysuria  Physical Exam  BP 117/70 (BP Location: Right Arm)    Pulse 83    Temp 98 F (36.7 C) (Oral)    Resp (!) 30    Ht 6' (1.829 m)    Wt 86.2 kg    SpO2 100%    BMI 25.77 kg/m  Gen:   Awake, appears very uncomfortable, difficulty sitting still Resp:  Normal effort  MSK:   Moves extremities without difficulty  Other:  Tenderness in the right upper quadrant and in the right flank  Medical Decision Making  Medically screening exam initiated at 12:10 AM.  Appropriate orders placed.  RYLAN BERNARD was informed that the remainder of the evaluation will be completed by another provider, this initial triage assessment does not replace that evaluation, and the importance of remaining in the ED until their evaluation is complete.  Concern for potential renal stone versus cholecystitis.  Work-up initiated   Dartha Lodge, [place] 09/06/21 [id number]

## 2021-09-06 NOTE — Anesthesia Procedure Notes (Signed)
Procedure Name: Intubation Date/Time: 09/06/2021 3:50 PM Performed by: Janace Litten, CRNA Pre-anesthesia Checklist: Patient identified, Emergency Drugs available, Suction available and Patient being monitored Patient Re-evaluated:Patient Re-evaluated prior to induction Oxygen Delivery Method: Circle System Utilized Preoxygenation: Pre-oxygenation with 100% oxygen Induction Type: IV induction Laryngoscope Size: Mac and 4 Grade View: Grade I Tube type: Oral Tube size: 7.0 mm Number of attempts: 1 Airway Equipment and Method: Stylet and Oral airway Placement Confirmation: ETT inserted through vocal cords under direct vision, positive ETCO2 and breath sounds checked- equal and bilateral Secured at: 22 cm Tube secured with: Tape Dental Injury: Teeth and Oropharynx as per pre-operative assessment

## 2021-09-06 NOTE — Op Note (Signed)
Preoperative diagnosis:Acute Cholecystitis  Postoperative diagnosis:Acute Cholecystitis  Procedure: Laparoscopic cholecystectomy  Surgeon: Edrick Kins, MD, Christell Constant. Magnus Ivan, M.D.   Anesthesia: Gen.  Indication: Acute Cholecystitis  Technique: The patient was brought to the operating room, placed supine on the operating table, and a general anesthetic was administered. The hair on the abdominal wall was clipped as was necessary. The abdominal wall was then sterilely prepped and draped. Local anesthetic (Marcaine) was infiltrated in the subumbilical region. A small subumbilical incision was made through the skin, subcutaneous tissue, fascia, and peritoneum entering the peritoneal cavity under direct vision. A pursestring suture of 0 Vicryl was placed around the edges of the fascia. A Hassan trocar was introduced into the peritoneal cavity and a pneumoperitoneum was created by insufflation of carbon dioxide gas. The laparoscope was introduced into the trocar and no underlying bleeding or organ injury was noted. The gallbladder was distended and inflamed. The patient was then placed in the reverse Trendelenburg position with the right side tilted slightly up.  Three more trochars were then placed into the abdominal cavity under laparoscopic vision. One in the epigastric area, and 2 in the right upper quadrant area. The gallbladder was visualized and the fundus was grasped and retracted toward the right shoulder.  The infundibulum was mobilized with dissection close to the gallbladder and retracted laterally. The cystic duct was identified and a window was created around it. The cystic artery was also identified and a window was created around it. The critical view was achieved.   The cystic duct was clipped 3 times on the biliary side, and once on the specimen side. The cystic duct was divided sharply. No bile leak was noted from the cystic duct stump.  The cystic artery was then clipped and  divided. Following this the gallbladder was dissected free from the liver using electrocautery. The gallbladder was then placed in a retrieval bag and removed from the abdominal cavity through the subumbilical incision.  The gallbladder fossa was inspected, irrigated, and bleeding was controlled with electrocautery. Inspection showed that hemostasis was adequate and there was no evidence of bile leak.  The irrigation fluid was evacuated as much as possible.  The subumbilical trocar was removed and the fascial defect was closed by tightening and tying down the pursestring suture under laparoscopic vision.  The remaining trochars were removed and the pneumoperitoneum was released. The skin incisions were closed with 4-0 Monocryl subcuticular stitches.   The procedure was well-tolerated without any apparent complications. The patient was taken to the recovery room in satisfactory condition.  Edrick Kins, MD PGY-5, [facility]

## 2021-09-06 NOTE — ED Notes (Signed)
Pt signed consent.

## 2021-09-07 ENCOUNTER — Encounter (HOSPITAL_COMMUNITY): Payer: Self-pay | Admitting: Surgery

## 2021-09-07 NOTE — Discharge Summary (Signed)
° ° °  Patient ID: Angel Huber [id number] 05-18-1991 30 y.o.  Admit date: 09/06/2021 Discharge date: 09/06/2021  Admitting Diagnosis: Acute cholecystitis  Discharge Diagnosis Patient Active Problem List   Diagnosis Date Noted   Acute cholecystitis 09/06/2021    Consultants none  Reason for Admission: Angel Huber is a 31 y.o. male with no significant pmhx who presented to the ED for abdominal pain.  Patient reports 2 days ago he began having constant right upper abdominal pain with radiation to his back/R flank. Initial episode improved but since then his symptoms have waxed and waned although were less severe. Last night he developed severe pain again. Associated with nausea and vomiting. Pain is worse after PO intake. He also felt like he looked a little yellow/jaundice the other day. No hx of the same.  He received Percocet in the ED with mild relief.  No associated fever, chills, diarrhea or urinary symptoms.     In the ED he has been afebrile without tachycardia or hypotension. Workup significant with WBC 7.8, lipase wnl, lft's wnl; CT renal stone study with Cholelithiasis with GB distension and question of cystic duct stone. RUQ [place] with 1.1 cm stone in gallbladder neck, gallbladder wall thickness to 7.4 mm.  No mention of pericholecystic fluid.  CBD within normal limits at 4.3 mm. We were asked to see.   Procedures Lap chole, Dr. Magnus Ivan 09/06/21  Hospital Course:  The patient was admitted and underwent a laparoscopic cholecystectomy.  The patient tolerated the procedure well.  On POD 0, the patient was tolerating a regular diet, voiding well, mobilizing, and pain was controlled with oral pain medications.  The patient was stable for DC home at this time with appropriate follow up made.  Allergies as of 09/06/2021       Reactions   Food    RED MEAT ALLERGY        Medication List     STOP taking these medications    oxyCODONE-acetaminophen 5-325 MG  tablet Commonly known as: PERCOCET/ROXICET       TAKE these medications    acetaminophen 500 MG tablet Commonly known as: TYLENOL Take 2 tablets (1,000 mg total) by mouth every 6 (six) hours as needed for mild pain.   ibuprofen 200 MG tablet Commonly known as: ADVIL Take 3 tablets (600 mg total) by mouth every 8 (eight) hours as needed for headache. What changed:  how much to take when to take this   oxyCODONE 5 MG immediate release tablet Commonly known as: Oxy IR/ROXICODONE Take 1-2 tablets (5-10 mg total) by mouth every 6 (six) hours as needed for moderate pain or severe pain.          Follow-up Information     [facility], [place]. Call.   Specialty: General Surgery Why: We are working on your appointment, call to confirm Please arrive 30 minutes prior to your appointment to check in and fill out paperwork. Bring photo ID and insurance information. Contact information: [address] [place] [address] [phone]                Signed: Barnetta Chapel, [facility] 09/07/2021, 1:34 PM Please see Amion for pager number during day hours 7:00am-4:30pm, 7-11:30am on Weekends

## 2021-09-10 LAB — SURGICAL PATHOLOGY

## 2022-03-30 IMAGING — CT CT RENAL STONE PROTOCOL
2 of 4 series · 16 of 46 positions shown, 18 images · non-contrast
Comparison: CT abdomen and pelvis 03/16/2013.

CLINICAL DATA: Flank pain.



[Series 3: renal stone 5.0 · axial · 0.98mm/px · z∈[+603,+1063]mm · 13 of 102 slices shown, 15 images]
[im 5/102  soft-tissue]
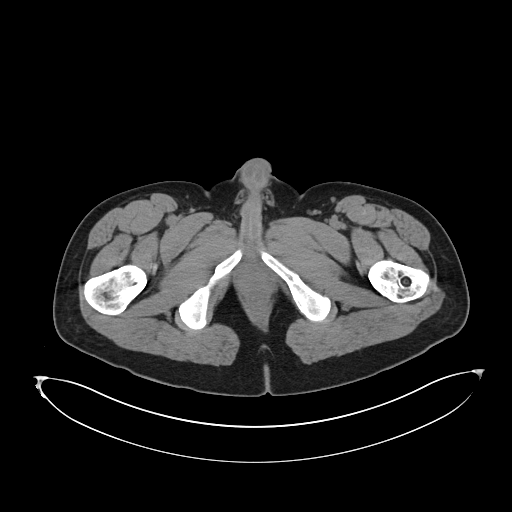
[im 5/102  bone]
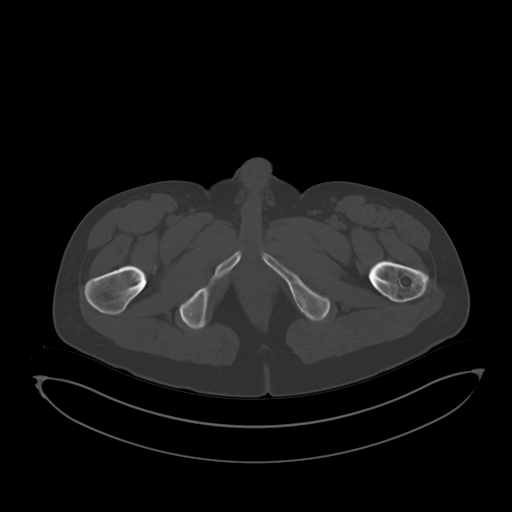
[im 13/102  soft-tissue]
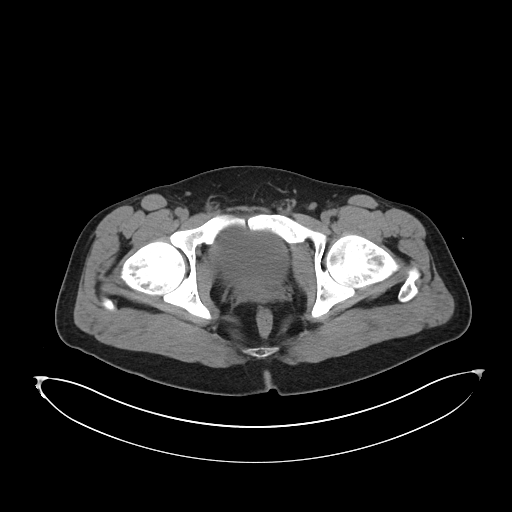
[im 22/102  soft-tissue]
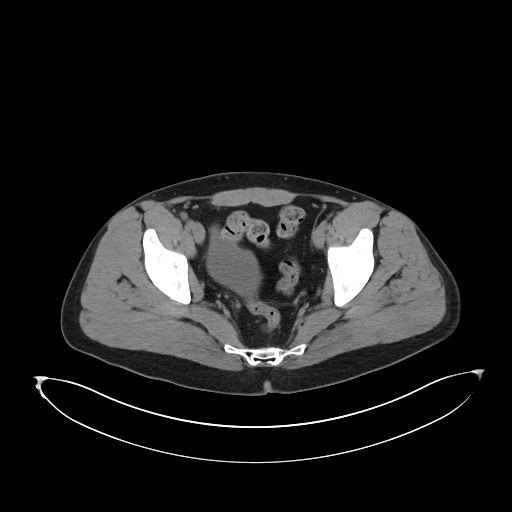
[im 30/102  soft-tissue]
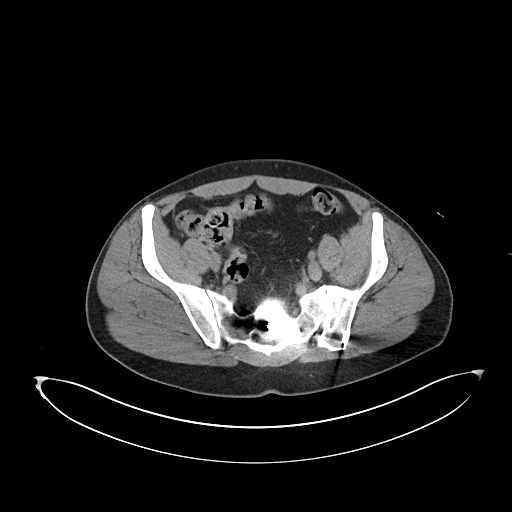
[im 34/102  soft-tissue]
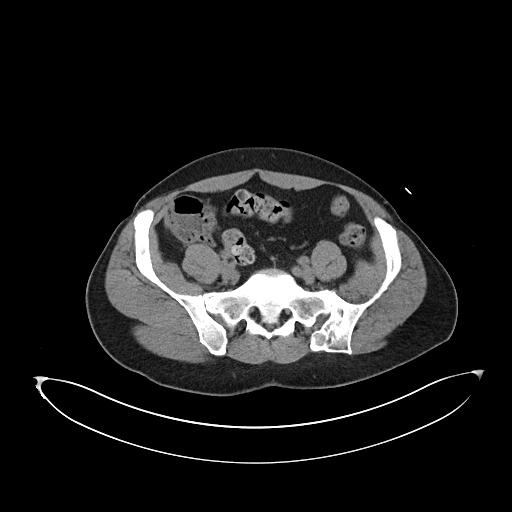
[im 43/102  soft-tissue]
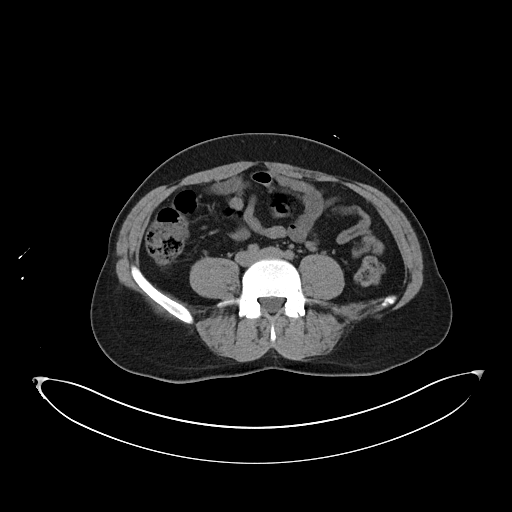
[im 51/102  soft-tissue]
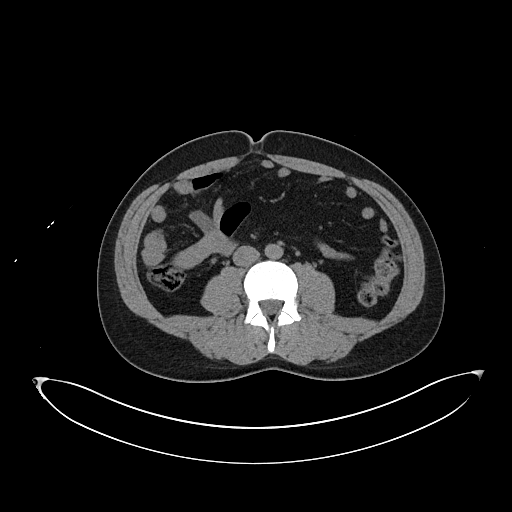
[im 59/102  soft-tissue]
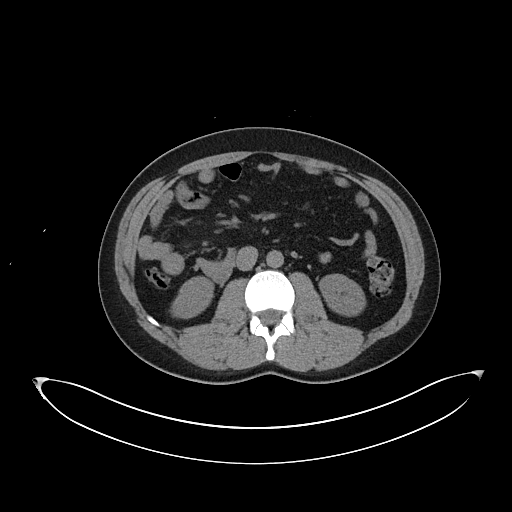
[im 68/102  soft-tissue]
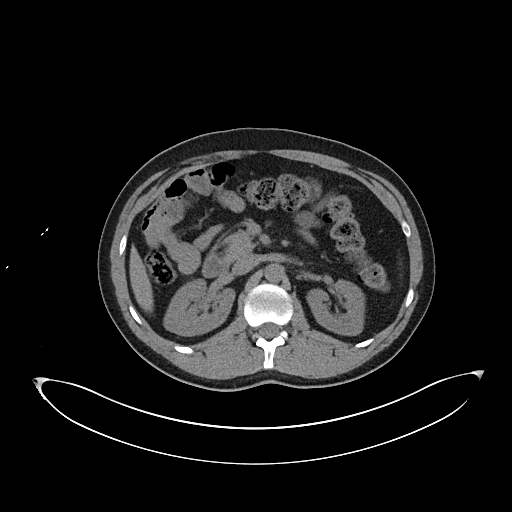
[im 68/102  bone]
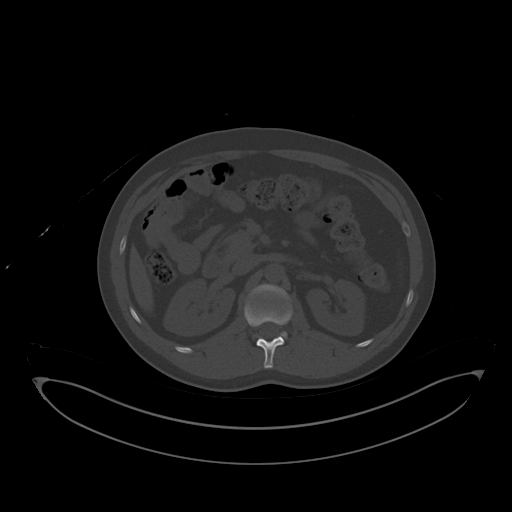
[im 72/102  soft-tissue]
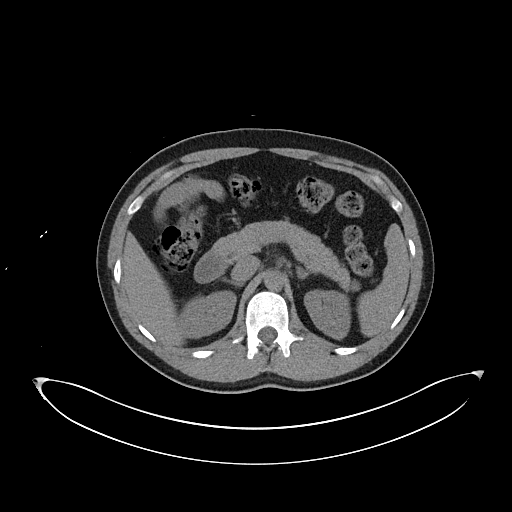
[im 80/102  soft-tissue]
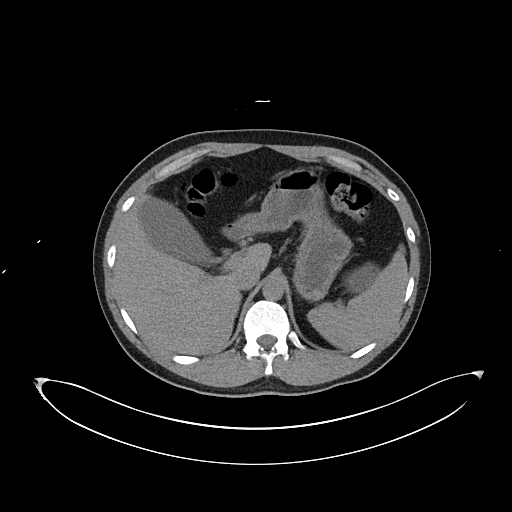
[im 89/102  soft-tissue]
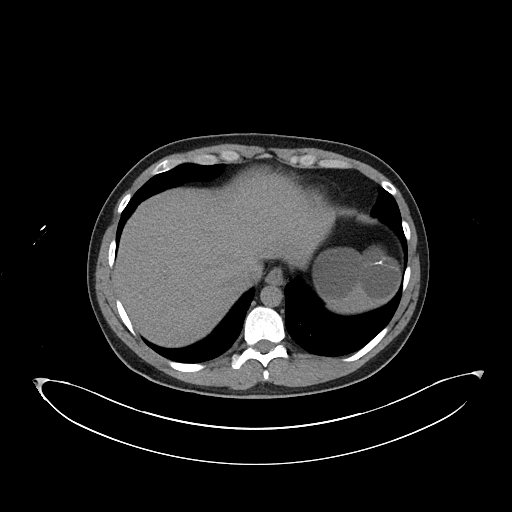
[im 97/102  soft-tissue]
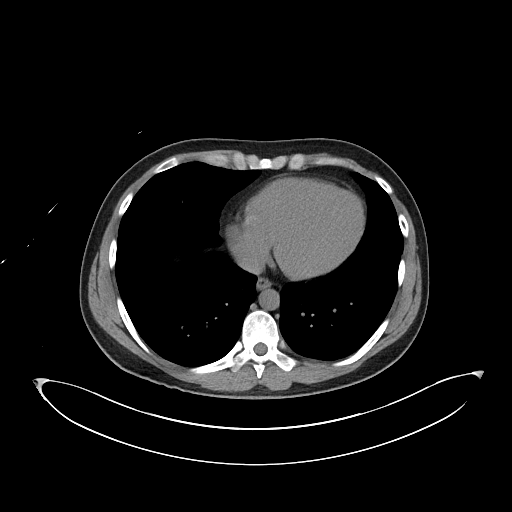

[Series 6: cor · coronal · 0.99mm/px · 3 of 150 slices shown]
[im 50/150  soft-tissue]
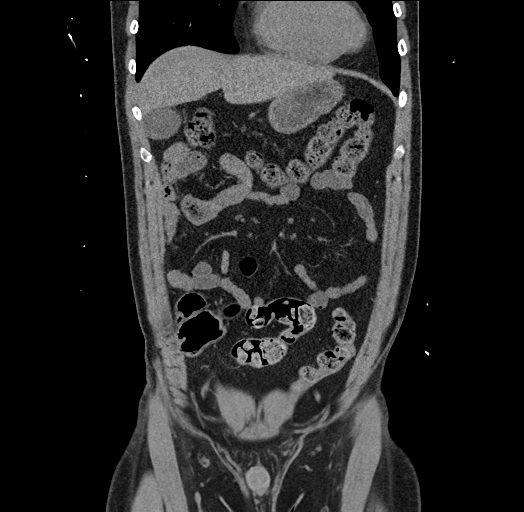
[im 67/150  soft-tissue]
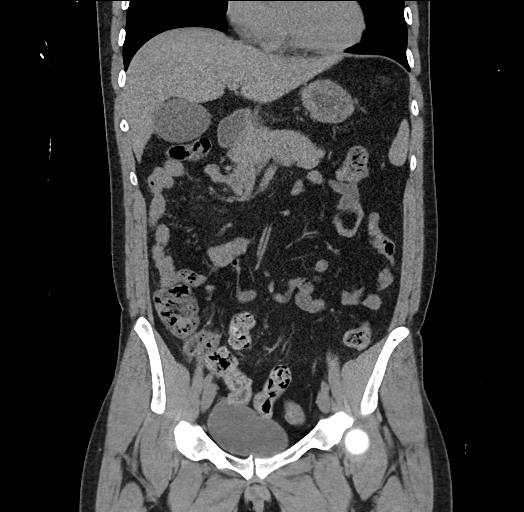
[im 83/150  soft-tissue]
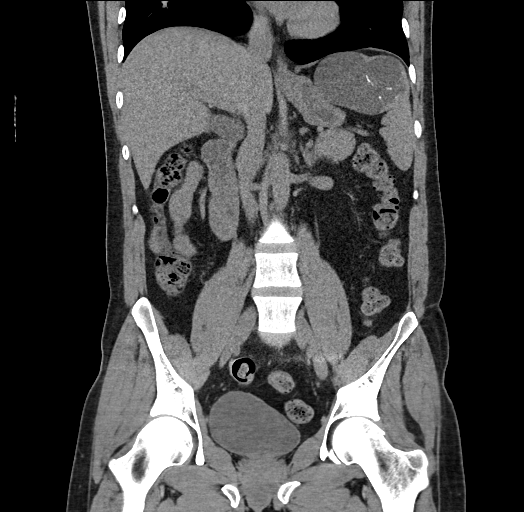

[16 of 46 positions shown; findings below may reference images not displayed]

FINDINGS: Lower chest: No acute abnormality.

Hepatobiliary: Small gallstones are present. There is a questionable
punctate calculus within the cystic duct image [DATE]. Gallbladder is
distended. There is no biliary ductal dilatation. Bile ducts are
within normal limits.

Pancreas: Unremarkable. No pancreatic ductal dilatation or
surrounding inflammatory changes.

Spleen: Complex cystic area in the spleen with calcified septations
measuring 5.6 x 9.0 cm has mildly increased in size compared to
9919. Spleen is otherwise within normal limits.

Adrenals/Urinary Tract: Adrenal glands are unremarkable. Kidneys are
normal, without renal calculi, focal lesion, or hydronephrosis.
Bladder is unremarkable.

Stomach/Bowel: Stomach is within normal limits. Appendix appears
normal. No evidence of bowel wall thickening, distention, or
inflammatory changes. There is a large amount of stool throughout
the colon.

Vascular/Lymphatic: No significant vascular findings are present. No
enlarged abdominal or pelvic lymph nodes.

Reproductive: Prostate is unremarkable.

Other: There is a small fat containing umbilical hernia. There is no
ascites or free.

Musculoskeletal: Left sacroiliac joint screw is again noted.
Sequelae from old trauma involving pelvis appears similar to the
prior study. No acute fractures are seen.
IMPRESSION: 1. Cholelithiasis with gallbladder distension and questionable tiny
stone in the cystic duct. Please correlate clinically for signs and
symptoms of cholecystitis. Consider ultrasound.
2. No evidence for urinary tract calculus or obstruction.
3. Complex cystic area in the spleen has minimally increased in
size, likely sequelae from old trauma.
